# Patient Record
Sex: Male | Born: 1980 | Race: Black or African American | Hispanic: No | Marital: Single | Smoking: Never smoker
Health system: Southern US, Community
[De-identification: ages and names within clinical notes are randomized; demographics above are authoritative.]

## PROBLEM LIST (undated history)

## (undated) DIAGNOSIS — E119 Type 2 diabetes mellitus without complications: Secondary | ICD-10-CM

## (undated) HISTORY — PX: CORONARY ARTERY BYPASS GRAFT: SHX141

## (undated) HISTORY — PX: CARDIAC SURGERY: SHX584

---

## 2014-08-05 ENCOUNTER — Encounter (HOSPITAL_BASED_OUTPATIENT_CLINIC_OR_DEPARTMENT_OTHER): Payer: Self-pay

## 2014-08-05 ENCOUNTER — Emergency Department (HOSPITAL_BASED_OUTPATIENT_CLINIC_OR_DEPARTMENT_OTHER): Payer: Self-pay

## 2014-08-05 ENCOUNTER — Emergency Department (HOSPITAL_BASED_OUTPATIENT_CLINIC_OR_DEPARTMENT_OTHER)
Admission: EM | Admit: 2014-08-05 | Discharge: 2014-08-05 | Disposition: A | Discharge status: Home / Self Care | Payer: Self-pay | Attending: Emergency Medicine | Admitting: Emergency Medicine

## 2014-08-05 DIAGNOSIS — R221 Localized swelling, mass and lump, neck: Secondary | ICD-10-CM

## 2014-08-05 DIAGNOSIS — L0211 Cutaneous abscess of neck: Secondary | ICD-10-CM | POA: Insufficient documentation

## 2014-08-05 DIAGNOSIS — R739 Hyperglycemia, unspecified: Secondary | ICD-10-CM | POA: Insufficient documentation

## 2014-08-05 LAB — CBC WITH DIFFERENTIAL/PLATELET
Basophils Absolute: 0.1 10*3/uL (ref 0.0–0.1)
Basophils Relative: 1 % (ref 0–1)
EOS PCT: 1 % (ref 0–5)
Eosinophils Absolute: 0.1 10*3/uL (ref 0.0–0.7)
HCT: 44.7 % (ref 39.0–52.0)
Hemoglobin: 15.1 g/dL (ref 13.0–17.0)
LYMPHS ABS: 1.7 10*3/uL (ref 0.7–4.0)
LYMPHS PCT: 21 % (ref 12–46)
MCH: 26.8 pg (ref 26.0–34.0)
MCHC: 33.8 g/dL (ref 30.0–36.0)
MCV: 79.4 fL (ref 78.0–100.0)
MONO ABS: 0.7 10*3/uL (ref 0.1–1.0)
MONOS PCT: 8 % (ref 3–12)
Neutro Abs: 5.8 10*3/uL (ref 1.7–7.7)
Neutrophils Relative %: 69 % (ref 43–77)
Platelets: 336 10*3/uL (ref 150–400)
RBC: 5.63 MIL/uL (ref 4.22–5.81)
RDW: 11.8 % (ref 11.5–15.5)
WBC: 8.3 10*3/uL (ref 4.0–10.5)

## 2014-08-05 LAB — BASIC METABOLIC PANEL
Anion gap: 12 (ref 5–15)
BUN: 11 mg/dL (ref 6–23)
CO2: 27 mmol/L (ref 19–32)
Calcium: 9.9 mg/dL (ref 8.4–10.5)
Chloride: 93 mEq/L — ABNORMAL LOW (ref 96–112)
Creatinine, Ser: 1.24 mg/dL (ref 0.50–1.35)
GFR calc Af Amer: 88 mL/min — ABNORMAL LOW (ref >90)
GFR, EST NON AFRICAN AMERICAN: 76 mL/min — AB (ref >90)
GLUCOSE: 400 mg/dL — AB (ref 70–99)
Potassium: 4 mmol/L (ref 3.5–5.1)
Sodium: 132 mmol/L — ABNORMAL LOW (ref 135–145)

## 2014-08-05 LAB — I-STAT CG4 LACTIC ACID, ED: Lactic Acid, Venous: 1.54 mmol/L (ref 0.5–2.2)

## 2014-08-05 LAB — CBG MONITORING, ED
Glucose-Capillary: 348 mg/dL — ABNORMAL HIGH (ref 70–99)
Glucose-Capillary: 287 mg/dL — ABNORMAL HIGH (ref 70–99)

## 2014-08-05 MED ORDER — IOHEXOL 300 MG/ML  SOLN
75.0000 mL | Freq: Once | INTRAMUSCULAR | Status: AC | PRN
  Administered 2014-08-05: 75 mL via INTRAVENOUS

## 2014-08-05 MED ORDER — CLINDAMYCIN PHOSPHATE 600 MG/50ML IV SOLN
600.0000 mg | Freq: Once | INTRAVENOUS | Status: AC
  Administered 2014-08-05: 600 mg via INTRAVENOUS
  Filled 2014-08-05: qty 50

## 2014-08-05 MED ORDER — METFORMIN HCL 500 MG PO TABS: 500.0000 mg | ORAL_TABLET | Freq: Two times a day (BID) | ORAL | Status: DC

## 2014-08-05 MED ORDER — TRAMADOL HCL 50 MG PO TABS: 50.0000 mg | ORAL_TABLET | Freq: Four times a day (QID) | ORAL | Status: AC | PRN

## 2014-08-05 MED ORDER — SODIUM CHLORIDE 0.9 % IV BOLUS (SEPSIS)
1000.0000 mL | Freq: Once | INTRAVENOUS | Status: AC
  Administered 2014-08-05: 1000 mL via INTRAVENOUS

## 2014-08-05 MED ORDER — CLINDAMYCIN HCL 150 MG PO CAPS: 450.0000 mg | ORAL_CAPSULE | Freq: Three times a day (TID) | ORAL | Status: AC

## 2014-08-05 NOTE — ED Notes (Addendum)
Pt reports 4-5 days of swelling on L side of neck, no dental pain.  Denies swallowing or airway difficulty.  Reports hx of abscesses on back of neck.  Denies fever.  Reports took benadryl and swelling decreased and is now back.

## 2014-08-05 NOTE — Discharge Instructions (Signed)
Return to the emergency Department immediately if you develop difficulty swallowing or difficulty breathing.  Follow up with Dr. Pollyann Kennedy with ENT in the next 1-2 days.

## 2014-08-05 NOTE — ED Provider Notes (Signed)
Patient signed out to me by Santiago Glad, PA-C with plan to follow-up on patient's condition after fluid boluses.  6:00 PM: Fluid bolus given, patient CBG down to 287. Patient discharged with previous instructions to follow-up with Dr. Pollyann Kennedy with ENT, antibiotics, and encouraged to follow-up with wellness clinic for hyperglycemia. Return precautions discussed with patient, and patient encouraged to call or return to ER should he have any questions or concerns.  BP 130/91 mmHg  Pulse 82  Temp(Src) 98.7 F (37.1 C) (Tympanic)  Resp 18  Ht  (1.854 m)  Wt 240 lb (108.863 kg)  BMI 31.67 kg/m2  SpO2 100%  Signed,  Ladona Mow, PA-C 6:17 PM   Monte Fantasia, PA-C 08/05/14 [phone]

## 2014-08-05 NOTE — ED Provider Notes (Signed)
CSN: [id number]     Arrival date & time 08/05/14  1346 History   First MD Initiated Contact with Patient 08/05/14 1417     Chief Complaint  Patient presents with  . Lymphadenopathy     (Consider location/radiation/quality/duration/timing/severity/associated sxs/prior Treatment) HPI Comments: Patient presents with a chief complaint of swelling of the left lateral neck.  Swelling has been present for the past 3-4 days and is gradually worsening.  He reports that the area is painful with movement, but pain is minimal when not moving.  Area is also tender to palpation.  No acute injury or trauma.  No treatment prior to arrival.  He reports that he has a MRSA infection of his posterior neck in September 2015.  He denies fever, chills, sore throat, sinus pain, congestion, difficulty swallowing, nausea, vomiting, or difficulty breathing.  No history of DM or HIV.    The history is provided by the patient.    History reviewed. No pertinent past medical history. Past Surgical History  Procedure Laterality Date  . Cardiac surgery     No family history on file. History  Substance Use Topics  . Smoking status: Never Smoker   . Smokeless tobacco: Not on file  . Alcohol Use: Yes     Comment: occ    Review of Systems  All other systems reviewed and are negative.     Allergies  Review of patient's allergies indicates no known allergies.  Home Medications   Prior to Admission medications   Not on File   BP 125/86 mmHg  Pulse 100  Temp(Src) 98.7 F (37.1 C) (Tympanic)  Resp 18  Ht  (1.854 m)  Wt 240 lb (108.863 kg)  BMI 31.67 kg/m2  SpO2 99% Physical Exam  Constitutional: He appears well-developed and well-nourished.  HENT:  Head: Normocephalic and atraumatic.  Mouth/Throat: Uvula is midline, oropharynx is clear and moist and mucous membranes are normal. No oral lesions. No trismus in the jaw. No dental abscesses or uvula swelling. No oropharyngeal exudate, posterior  oropharyngeal edema, posterior oropharyngeal erythema or tonsillar abscesses.  Airway patent  Eyes: EOM are normal.  Neck: Normal range of motion. Neck supple. Normal carotid pulses present. No spinous process tenderness present. No rigidity. No erythema and normal range of motion present.    Cardiovascular: Normal rate, regular rhythm and normal heart sounds.   Pulmonary/Chest: Effort normal and breath sounds normal. No stridor.  Musculoskeletal: Normal range of motion.  Neurological: He is alert.  Skin: Skin is warm and dry.  Psychiatric: He has a normal mood and affect.  Nursing note and vitals reviewed.   ED Course  Procedures (including critical care time) Labs Review Labs Reviewed  CBC WITH DIFFERENTIAL  BASIC METABOLIC PANEL  LACTIC ACID, PLASMA  I-STAT CG4 LACTIC ACID, ED    Imaging Review Ct Soft Tissue Neck W Contrast  08/05/2014   CLINICAL DATA:  Left neck mass with associated pain for the past 3 days.  EXAM: CT NECK WITH CONTRAST  TECHNIQUE: Multidetector CT imaging of the neck was performed using the standard protocol following the bolus administration of intravenous contrast.  CONTRAST:  75mL OMNIPAQUE IOHEXOL 300 MG/ML  SOLN  COMPARISON:  None.  FINDINGS: Pharynx and larynx: Unremarkable.  Salivary glands: Unremarkable.  Thyroid: Unremarkable.  Lymph nodes: Multiple enlarged lymph nodes throughout the left neck. There is associated soft tissue stranding in the fat at the posterior triangle and in the posterior subcutaneous fat. There is a small fluid collection with  associated rim enhancement in the adjacent musculature at the posterior aspect of the posterior triangle on the left. This measures 1.2 x 0.8 cm on image number 51. This measures 1.1 cm in length on sagittal image number 91. There is also a posterior cervical node with some central low density on image number 39, measuring 1.3 x 1.0 cm in maximum dimensions and ill-defined margins.  Vascular: Unremarkable.   Limited intracranial: Unremarkable.  Mastoids and visualized paranasal sinuses: Small left maxillary sinus retention cysts. Normally pneumatized mastoids.  Skeleton: Unremarkable.  Upper chest: Unremarkable.  IMPRESSION: Left neck suppurative adenitis with a 1.2 x 1.1 x 0.8 cm left posterior intramuscular abscess.   Electronically Signed   By: Gordan Payment M.D.   On: 08/05/2014 15:38     EKG Interpretation None     4:00 PM Patient discussed with Dr. Pollyann Kennedy with ENT.  He recommends starting the patient on Clindamycin and follow up in the office in the next 1-2 days. MDM   Final diagnoses:  Mass of left side of neck   Patient presents today with swelling of the left posterior lateral neck that has been present for the past 3-4 days.  CT scan today showing left posterior intramuscular abscess and suppurative adenitis.  No erythema or warmth of the area.  No difficulty breathing or swallowing. He is afebrile.  No leukocytosis.  Lactate WNL.  VSS.  Non toxic appearing.  Discussed CT results with Dr. Pollyann Kennedy with ENT who recommended starting the patient on Clindamycin and follow up in the office in the next 1-2 days.  Patient also found to have an elevated blood glucose of 400.  No known history of DM.  No recent steroid use.  Anion gap is 12.  Bicarb is 27.  Therefore, do not feel that the patient is in DKA.  Patient given 2 L NS IVF.  Plan is to recheck blood sugar after IVF is completed.  Patient signed out to Ladona Mow, PA-C at shift change who will follow up on the blood sugar results.  Patient is to be discharged home on Clindamycin and Metformin.  Patient instructed to follow up with Dr. Pollyann Kennedy with ENT for the abscess and PCP for new onset DM.  Patient verbalizes understanding.  Return precautions given.      Santiago Glad, PA-C 08/07/14 1142  Glynn Octave, MD 08/07/14 [phone]

## 2014-10-14 ENCOUNTER — Encounter (HOSPITAL_COMMUNITY): Payer: Self-pay | Admitting: Emergency Medicine

## 2014-10-14 ENCOUNTER — Emergency Department (HOSPITAL_COMMUNITY): Payer: BLUE CROSS/BLUE SHIELD

## 2014-10-14 ENCOUNTER — Emergency Department (HOSPITAL_COMMUNITY)
Admission: EM | Admit: 2014-10-14 | Discharge: 2014-10-14 | Disposition: A | Discharge status: Home / Self Care | Payer: BLUE CROSS/BLUE SHIELD | Attending: Emergency Medicine | Admitting: Emergency Medicine

## 2014-10-14 DIAGNOSIS — S61411A Laceration without foreign body of right hand, initial encounter: Secondary | ICD-10-CM | POA: Diagnosis not present

## 2014-10-14 DIAGNOSIS — S61412A Laceration without foreign body of left hand, initial encounter: Secondary | ICD-10-CM | POA: Insufficient documentation

## 2014-10-14 DIAGNOSIS — Y92410 Unspecified street and highway as the place of occurrence of the external cause: Secondary | ICD-10-CM | POA: Insufficient documentation

## 2014-10-14 DIAGNOSIS — S81812A Laceration without foreign body, left lower leg, initial encounter: Secondary | ICD-10-CM | POA: Insufficient documentation

## 2014-10-14 DIAGNOSIS — Y998 Other external cause status: Secondary | ICD-10-CM | POA: Diagnosis not present

## 2014-10-14 DIAGNOSIS — Z951 Presence of aortocoronary bypass graft: Secondary | ICD-10-CM | POA: Diagnosis not present

## 2014-10-14 DIAGNOSIS — S59912A Unspecified injury of left forearm, initial encounter: Secondary | ICD-10-CM | POA: Diagnosis present

## 2014-10-14 DIAGNOSIS — Y9389 Activity, other specified: Secondary | ICD-10-CM | POA: Insufficient documentation

## 2014-10-14 DIAGNOSIS — S80212A Abrasion, left knee, initial encounter: Secondary | ICD-10-CM | POA: Insufficient documentation

## 2014-10-14 DIAGNOSIS — S51812A Laceration without foreign body of left forearm, initial encounter: Secondary | ICD-10-CM | POA: Insufficient documentation

## 2014-10-14 DIAGNOSIS — Z9889 Other specified postprocedural states: Secondary | ICD-10-CM | POA: Diagnosis not present

## 2014-10-14 DIAGNOSIS — IMO0002 Reserved for concepts with insufficient information to code with codable children: Secondary | ICD-10-CM

## 2014-10-14 MED ORDER — OXYCODONE-ACETAMINOPHEN 5-325 MG PO TABS
2.0000 | ORAL_TABLET | Freq: Once | ORAL | Status: AC
  Administered 2014-10-14: 2 via ORAL
  Filled 2014-10-14: qty 2

## 2014-10-14 MED ORDER — LIDOCAINE-EPINEPHRINE (PF) 2 %-1:200000 IJ SOLN
10.0000 mL | Freq: Once | INTRAMUSCULAR | Status: AC
  Administered 2014-10-14: 10 mL
  Filled 2014-10-14: qty 10

## 2014-10-14 MED ORDER — CEPHALEXIN 500 MG PO CAPS: 500.0000 mg | ORAL_CAPSULE | Freq: Four times a day (QID) | ORAL | Status: AC
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
## 2014-10-14 NOTE — ED Notes (Signed)
Wound care completed to wounds.  Deeper laceration noted to left hand.  Pt has abrasions and pain to bil hands, left knee, left forearm, and left leg.

## 2014-10-14 NOTE — Discharge Instructions (Signed)
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
## 2021-04-27 ENCOUNTER — Other Ambulatory Visit: Payer: Self-pay

## 2021-04-27 ENCOUNTER — Emergency Department (HOSPITAL_COMMUNITY)
Admission: EM | Admit: 2021-04-27 | Discharge: 2021-04-28 | Discharge status: Against Medical Advice | Payer: BLUE CROSS/BLUE SHIELD | Attending: Emergency Medicine | Admitting: Emergency Medicine

## 2021-04-27 DIAGNOSIS — R739 Hyperglycemia, unspecified: Secondary | ICD-10-CM | POA: Insufficient documentation

## 2021-04-27 DIAGNOSIS — Z5321 Procedure and treatment not carried out due to patient leaving prior to being seen by health care provider: Secondary | ICD-10-CM | POA: Insufficient documentation

## 2021-04-27 LAB — CBC WITH DIFFERENTIAL/PLATELET
Abs Immature Granulocytes: 0.01 10*3/uL (ref 0.00–0.07)
Basophils Absolute: 0.1 10*3/uL (ref 0.0–0.1)
Basophils Relative: 1 %
Eosinophils Absolute: 0.5 10*3/uL (ref 0.0–0.5)
Eosinophils Relative: 10 %
HCT: 49.8 % (ref 39.0–52.0)
Hemoglobin: 16.5 g/dL (ref 13.0–17.0)
Immature Granulocytes: 0 %
Lymphocytes Relative: 54 %
Lymphs Abs: 2.9 10*3/uL (ref 0.7–4.0)
MCH: 27.9 pg (ref 26.0–34.0)
MCHC: 33.1 g/dL (ref 30.0–36.0)
MCV: 84.3 fL (ref 80.0–100.0)
Monocytes Absolute: 0.3 10*3/uL (ref 0.1–1.0)
Monocytes Relative: 6 %
Neutro Abs: 1.6 10*3/uL — ABNORMAL LOW (ref 1.7–7.7)
Neutrophils Relative %: 29 %
Platelets: 297 10*3/uL (ref 150–400)
RBC: 5.91 MIL/uL — ABNORMAL HIGH (ref 4.22–5.81)
RDW: 11.7 % (ref 11.5–15.5)
WBC: 5.4 10*3/uL (ref 4.0–10.5)
nRBC: 0 % (ref 0.0–0.2)

## 2021-04-27 LAB — I-STAT VENOUS BLOOD GAS, ED
Acid-Base Excess: 2 mmol/L (ref 0.0–2.0)
Bicarbonate: 26 mmol/L (ref 20.0–28.0)
Calcium, Ion: 1.12 mmol/L — ABNORMAL LOW (ref 1.15–1.40)
HCT: 52 % (ref 39.0–52.0)
Hemoglobin: 17.7 g/dL — ABNORMAL HIGH (ref 13.0–17.0)
O2 Saturation: 97 %
Potassium: 4.1 mmol/L (ref 3.5–5.1)
Sodium: 137 mmol/L (ref 135–145)
TCO2: 27 mmol/L (ref 22–32)
pCO2, Ven: 38.6 mmHg — ABNORMAL LOW (ref 44.0–60.0)
pH, Ven: 7.437 — ABNORMAL HIGH (ref 7.250–7.430)
pO2, Ven: 88 mmHg — ABNORMAL HIGH (ref 32.0–45.0)

## 2021-04-27 LAB — BASIC METABOLIC PANEL
Anion gap: 9 (ref 5–15)
BUN: 12 mg/dL (ref 6–20)
CO2: 23 mmol/L (ref 22–32)
Calcium: 9.5 mg/dL (ref 8.9–10.3)
Chloride: 101 mmol/L (ref 98–111)
Creatinine, Ser: 0.97 mg/dL (ref 0.61–1.24)
GFR, Estimated: >60 mL/min (ref >60)
Glucose, Bld: 306 mg/dL — ABNORMAL HIGH (ref 70–99)
Potassium: 4 mmol/L (ref 3.5–5.1)
Sodium: 133 mmol/L — ABNORMAL LOW (ref 135–145)

## 2021-04-27 LAB — URINALYSIS, ROUTINE W REFLEX MICROSCOPIC
Bacteria, UA: NONE SEEN
Bilirubin Urine: NEGATIVE
Glucose, UA: >=500 mg/dL — AB
Hgb urine dipstick: NEGATIVE
Ketones, ur: 5 mg/dL — AB
Leukocytes,Ua: NEGATIVE
Nitrite: NEGATIVE
Protein, ur: NEGATIVE mg/dL
Specific Gravity, Urine: 1.029 (ref 1.005–1.030)
pH: 5 (ref 5.0–8.0)

## 2021-04-27 LAB — CBG MONITORING, ED: Glucose-Capillary: 308 mg/dL — ABNORMAL HIGH (ref 70–99)

## 2021-04-27 NOTE — ED Provider Notes (Signed)
Emergency Medicine Provider Triage Evaluation Note  Elih Mooney , a 40 y.o. male  was evaluated in triage.  Pt complains of nausea, fatigue and hyperglycemia. Ran out of meds 2 weeks ago because he got a new job.  Review of Systems  Positive: Hyperglycemia, nausea, fatigue Negative: vomiting  Physical Exam  BP (!) 140/97 (BP Location: Left Arm)   Pulse 94   Temp 98.6 F (37 C) (Oral)   Resp 16   SpO2 98%  Gen:   Awake, no distress   Resp:  Normal effort  MSK:   Moves extremities without difficulty   Medical Decision Making  Medically screening exam initiated at 5:30 PM.  Appropriate orders placed.  Ezequiel Zobrist was informed that the remainder of the evaluation will be completed by another provider, this initial triage assessment does not replace that evaluation, and the importance of remaining in the ED until their evaluation is complete.     Rayne Du 04/27/21 1731    Linwood Dibbles, MD 04/27/21 2148

## 2021-04-27 NOTE — ED Notes (Signed)
Pt left AMA °

## 2021-04-27 NOTE — ED Triage Notes (Signed)
Pt here for eval of nausea and dizziness secondary to hyperglycemia. Has been out of his diabetes medication x 2 weeks d/t insurance problems.

## 2021-05-24 ENCOUNTER — Encounter (HOSPITAL_BASED_OUTPATIENT_CLINIC_OR_DEPARTMENT_OTHER): Payer: Self-pay | Admitting: *Deleted

## 2021-05-24 ENCOUNTER — Emergency Department (HOSPITAL_BASED_OUTPATIENT_CLINIC_OR_DEPARTMENT_OTHER)
Admission: EM | Admit: 2021-05-24 | Discharge: 2021-05-24 | Disposition: A | Discharge status: Home / Self Care | Payer: Self-pay | Attending: Emergency Medicine | Admitting: Emergency Medicine

## 2021-05-24 ENCOUNTER — Other Ambulatory Visit: Payer: Self-pay

## 2021-05-24 DIAGNOSIS — M791 Myalgia, unspecified site: Secondary | ICD-10-CM | POA: Insufficient documentation

## 2021-05-24 DIAGNOSIS — Z7984 Long term (current) use of oral hypoglycemic drugs: Secondary | ICD-10-CM | POA: Insufficient documentation

## 2021-05-24 DIAGNOSIS — E1165 Type 2 diabetes mellitus with hyperglycemia: Secondary | ICD-10-CM | POA: Insufficient documentation

## 2021-05-24 DIAGNOSIS — J101 Influenza due to other identified influenza virus with other respiratory manifestations: Secondary | ICD-10-CM | POA: Insufficient documentation

## 2021-05-24 DIAGNOSIS — Z951 Presence of aortocoronary bypass graft: Secondary | ICD-10-CM | POA: Insufficient documentation

## 2021-05-24 DIAGNOSIS — R739 Hyperglycemia, unspecified: Secondary | ICD-10-CM

## 2021-05-24 DIAGNOSIS — Z20822 Contact with and (suspected) exposure to covid-19: Secondary | ICD-10-CM | POA: Insufficient documentation

## 2021-05-24 HISTORY — DX: Type 2 diabetes mellitus without complications: E11.9

## 2021-05-24 LAB — CBG MONITORING, ED
Glucose-Capillary: 274 mg/dL — ABNORMAL HIGH (ref 70–99)
Glucose-Capillary: 226 mg/dL — ABNORMAL HIGH (ref 70–99)

## 2021-05-24 LAB — RESP PANEL BY RT-PCR (FLU A&B, COVID) ARPGX2
Influenza A by PCR: POSITIVE — AB
Influenza B by PCR: NEGATIVE
SARS Coronavirus 2 by RT PCR: NEGATIVE

## 2021-05-24 MED ORDER — METFORMIN HCL 500 MG PO TABS: 500.0000 mg | ORAL_TABLET | Freq: Two times a day (BID) | ORAL | 0 refills | Status: AC

## 2021-05-24 MED ORDER — PROMETHAZINE-DM 6.25-15 MG/5ML PO SYRP: 5.0000 mL | ORAL_SOLUTION | Freq: Four times a day (QID) | ORAL | 0 refills | Status: AC | PRN

## 2021-05-24 MED ORDER — ACETAMINOPHEN 500 MG PO TABS
1000.0000 mg | ORAL_TABLET | Freq: Once | ORAL | Status: AC
  Administered 2021-05-24: 1000 mg via ORAL
  Filled 2021-05-24: qty 2

## 2021-05-24 MED ORDER — BENZONATATE 100 MG PO CAPS: 100.0000 mg | ORAL_CAPSULE | Freq: Three times a day (TID) | ORAL | 0 refills | Status: AC

## 2021-05-24 MED ORDER — ONDANSETRON 4 MG PO TBDP
4.0000 mg | ORAL_TABLET | Freq: Once | ORAL | Status: AC
  Administered 2021-05-24: 4 mg via ORAL
  Filled 2021-05-24: qty 1

## 2021-05-24 NOTE — ED Provider Notes (Signed)
[facility] EMERGENCY DEPARTMENT Provider Note   CSN: [id number] Arrival date & time: 05/24/21  1809     History Chief Complaint  Patient presents with   Fever    Jaime Pruitt is a 40 y.o. male.  HPI Patient is a 40 year old male with past medical history significant for DM2 states that he ran out of his metformin prescription 2 weeks ago and has not taken any since.  States that he has had cough fevers body aches and chills and fatigue for the past 2 days.  States he has been feeling somewhat more than usual but not significantly so.  States he has been eating and drinking fine.  Denies any nausea or vomiting no chest pain or shortness of breath no lightheadedness or dizziness.  Came to the ER today for the symptoms.  States that he has not taken his temperature at home but felt subjectively that he had a fever was found to have a temperature of 100.3 in triage.  No hemoptysis or chest pain.  No other associate symptoms.  No aggravating mitigating factors.  Apart from somewhat better with antipyretics although he has taken none today.     Past Medical History:  Diagnosis Date   Diabetes mellitus without complication (HCC)     There are no problems to display for this patient.   Past Surgical History:  Procedure Laterality Date   CARDIAC SURGERY     CORONARY ARTERY BYPASS GRAFT         No family history on file.  Social History   Tobacco Use   Smoking status: Never   Smokeless tobacco: Never  Vaping Use   Vaping Use: Never used  Substance Use Topics   Alcohol use: Yes    Comment: occ   Drug use: No    Home Medications Prior to Admission medications   Medication Sig Start Date End Date Taking? Authorizing Provider  benzonatate (TESSALON) 100 MG capsule Take 1 capsule (100 mg total) by mouth every 8 (eight) hours. 05/24/21  Yes Ahmod Gillespie S, PA  promethazine-dextromethorphan (PROMETHAZINE-DM) 6.25-15 MG/5ML syrup Take 5 mLs by mouth 4 (four)  times daily as needed for cough. 05/24/21  Yes Cierrah Dace S, PA  cephALEXin (KEFLEX) 500 MG capsule Take 1 capsule (500 mg total) by mouth 4 (four) times daily. 10/14/14   Cartner, Sharlet Salina, PA-C  clindamycin (CLEOCIN) 150 MG capsule Take 3 capsules (450 mg total) by mouth 3 (three) times daily. Patient not taking: Reported on 10/14/2014 08/05/14   Santiago Glad, PA-C  ibuprofen (ADVIL,MOTRIN) 200 MG tablet Take 800 mg by mouth every 6 (six) hours as needed for moderate pain.    [provider]  metFORMIN (GLUCOPHAGE) 500 MG tablet Take 1 tablet (500 mg total) by mouth 2 (two) times daily with a meal. 05/24/21   Hyder Deman, Rodrigo Ran, PA  traMADol (ULTRAM) 50 MG tablet Take 1 tablet (50 mg total) by mouth every 6 (six) hours as needed. Patient not taking: Reported on 10/14/2014 08/05/14   Santiago Glad, PA-C    Allergies    Patient has no known allergies.  Review of Systems   Review of Systems  Constitutional:  Positive for chills, fatigue and fever.  HENT:  Positive for congestion.   Eyes:  Negative for pain.  Respiratory:  Positive for cough. Negative for shortness of breath.   Cardiovascular:  Negative for chest pain and leg swelling.  Gastrointestinal:  Negative for abdominal pain and vomiting.  Genitourinary:  Negative for  dysuria.  Musculoskeletal:  Positive for myalgias.  Skin:  Negative for rash.  Neurological:  Negative for dizziness and headaches.   Physical Exam Updated Vital Signs BP (!) 129/91   Pulse 89   Temp 98.2 F (36.8 C) (Oral)   Resp 20   Ht 6\' 1"  (1.854 m)   Wt 100.2 kg   SpO2 95%   BMI 29.16 kg/m   Physical Exam Vitals and nursing note reviewed.  Constitutional:      General: He is not in acute distress.    Comments: Pleasant well-appearing 40 year old.  In no acute distress.  Sitting comfortably in bed.  Able answer questions appropriately follow commands. No increased work of breathing. Speaking in full sentences.   HENT:     Head:  Normocephalic and atraumatic.     Nose: Nose normal.  Eyes:     General: No scleral icterus. Cardiovascular:     Rate and Rhythm: Normal rate and regular rhythm.     Pulses: Normal pulses.     Heart sounds: Normal heart sounds.  Pulmonary:     Effort: Pulmonary effort is normal. No respiratory distress.     Breath sounds: Normal breath sounds. No wheezing.  Abdominal:     Palpations: Abdomen is soft.     Tenderness: There is no abdominal tenderness.  Musculoskeletal:     Cervical back: Normal range of motion.     Right lower leg: No edema.     Left lower leg: No edema.  Skin:    General: Skin is warm and dry.     Capillary Refill: Capillary refill takes less than 2 seconds.  Neurological:     Mental Status: He is alert. Mental status is at baseline.  Psychiatric:        Mood and Affect: Mood normal.        Behavior: Behavior normal.    ED Results / Procedures / Treatments   Labs (all labs ordered are listed, but only abnormal results are displayed) Labs Reviewed  RESP PANEL BY RT-PCR (FLU A&B, COVID) ARPGX2 - Abnormal; Notable for the following components:      Result Value   Influenza A by PCR POSITIVE (*)    All other components within normal limits  CBG MONITORING, ED - Abnormal; Notable for the following components:   Glucose-Capillary 274 (*)    All other components within normal limits  CBG MONITORING, ED    EKG None  Radiology No results found.  Procedures Procedures   Medications Ordered in ED Medications  acetaminophen (TYLENOL) tablet 1,000 mg (1,000 mg Oral Given 05/24/21 2139)  ondansetron (ZOFRAN-ODT) disintegrating tablet 4 mg (4 mg Oral Given 05/24/21 2140)    ED Course  I have reviewed the triage vital signs and the nursing notes.  Pertinent labs & imaging results that were available during my care of the patient were reviewed by me and considered in my medical decision making (see chart for details).    MDM Rules/Calculators/A&P                           Patient is a 40 year old male history of diabetes off his metformin for 2 weeks now because he has not had it refilled.  Presented today with aches cough congestion fevers chills.  Found to be hyperglycemic with blood sugar of 274 this is likely multifactorial 1 because of viral illness and to because of noncompliance of medications  Offered to obtain labs  to ensure the patient is not in DKA.  He states that he feels pretty well after he was giving Tylenol.  His tachycardia resolved and he has no nausea vomiting abdominal pain is breathing normally and states that he would prefer to hold off on extensive work-up states that he would like to try hydrating p.o.  Positive for influenza A.  Negative for COVID and flu B.   Blood sugar improved to 226.  Patient tolerated p.o. fluids without difficulty.  Feels well.  Return precautions are given and I represcribed his metformin along with benzonatate and Promethazine DM.  Mussa Justiniano was evaluated in Emergency Department on 05/24/2021 for the symptoms described in the history of present illness. He was evaluated in the context of the global COVID-19 pandemic, which necessitated consideration that the patient might be at risk for infection with the SARS-CoV-2 virus that causes COVID-19. Institutional protocols and algorithms that pertain to the evaluation of patients at risk for COVID-19 are in a state of rapid change based on information released by regulatory bodies including the CDC and federal and state organizations. These policies and algorithms were followed during the patient's care in the ED.   Final Clinical Impression(s) / ED Diagnoses Final diagnoses:  Influenza A  Hyperglycemia    Rx / DC Orders ED Discharge Orders          Ordered    metFORMIN (GLUCOPHAGE) 500 MG tablet  2 times daily with meals        05/24/21 2129    benzonatate (TESSALON) 100 MG capsule  Every 8 hours        05/24/21 2129     promethazine-dextromethorphan (PROMETHAZINE-DM) 6.25-15 MG/5ML syrup  4 times daily PRN        05/24/21 2129             Gailen Shelter, [place] 05/25/21 Virginia Rochester, MD 05/27/21 [phone]

## 2021-05-24 NOTE — ED Triage Notes (Signed)
Cough, fever, body aches x2 days

## 2021-05-24 NOTE — Discharge Instructions (Signed)
Please drink plenty of water.  Please take the cough medications I prescribed you an alternate between Tylenol and ibuprofen.  It is imperative that you drink lots of water.  Please follow-up with your primary care provider to recheck your blood sugars and check your general labs within the next few days.  Begin taking the metformin that I prescribed you

## 2022-03-01 NOTE — Progress Notes (Signed)
Erroneous encounter-disregard

## 2022-03-10 ENCOUNTER — Encounter: Payer: Self-pay | Admitting: Family

## 2022-03-10 DIAGNOSIS — Z7689 Persons encountering health services in other specified circumstances: Secondary | ICD-10-CM

## 2023-01-24 ENCOUNTER — Emergency Department (HOSPITAL_BASED_OUTPATIENT_CLINIC_OR_DEPARTMENT_OTHER): Payer: BC Managed Care – PPO

## 2023-01-24 ENCOUNTER — Emergency Department (HOSPITAL_BASED_OUTPATIENT_CLINIC_OR_DEPARTMENT_OTHER)
Admission: EM | Admit: 2023-01-24 | Discharge: 2023-01-24 | Disposition: A | Discharge status: Home / Self Care | Payer: BC Managed Care – PPO | Attending: Emergency Medicine | Admitting: Emergency Medicine

## 2023-01-24 ENCOUNTER — Encounter (HOSPITAL_BASED_OUTPATIENT_CLINIC_OR_DEPARTMENT_OTHER): Payer: Self-pay | Admitting: Emergency Medicine

## 2023-01-24 DIAGNOSIS — I10 Essential (primary) hypertension: Secondary | ICD-10-CM

## 2023-01-24 DIAGNOSIS — R739 Hyperglycemia, unspecified: Secondary | ICD-10-CM

## 2023-01-24 DIAGNOSIS — Z79899 Other long term (current) drug therapy: Secondary | ICD-10-CM | POA: Diagnosis not present

## 2023-01-24 DIAGNOSIS — Y92410 Unspecified street and highway as the place of occurrence of the external cause: Secondary | ICD-10-CM | POA: Insufficient documentation

## 2023-01-24 DIAGNOSIS — E1165 Type 2 diabetes mellitus with hyperglycemia: Secondary | ICD-10-CM | POA: Insufficient documentation

## 2023-01-24 DIAGNOSIS — R55 Syncope and collapse: Secondary | ICD-10-CM | POA: Diagnosis not present

## 2023-01-24 DIAGNOSIS — Z7984 Long term (current) use of oral hypoglycemic drugs: Secondary | ICD-10-CM | POA: Diagnosis not present

## 2023-01-24 DIAGNOSIS — S46912A Strain of unspecified muscle, fascia and tendon at shoulder and upper arm level, left arm, initial encounter: Secondary | ICD-10-CM | POA: Diagnosis not present

## 2023-01-24 DIAGNOSIS — S40912A Unspecified superficial injury of left shoulder, initial encounter: Secondary | ICD-10-CM | POA: Diagnosis present

## 2023-01-24 HISTORY — DX: Type 2 diabetes mellitus without complications: E11.9

## 2023-01-24 LAB — BASIC METABOLIC PANEL
Anion gap: 11 (ref 5–15)
BUN: 15 mg/dL (ref 6–20)
CO2: 29 mmol/L (ref 22–32)
Calcium: 9.4 mg/dL (ref 8.9–10.3)
Chloride: 97 mmol/L — ABNORMAL LOW (ref 98–111)
Creatinine, Ser: 1.17 mg/dL (ref 0.61–1.24)
GFR, Estimated: >60 mL/min (ref >60)
Glucose, Bld: 321 mg/dL — ABNORMAL HIGH (ref 70–99)
Potassium: 3.8 mmol/L (ref 3.5–5.1)
Sodium: 137 mmol/L (ref 135–145)

## 2023-01-24 LAB — CBC WITH DIFFERENTIAL/PLATELET
Abs Immature Granulocytes: 0.01 10*3/uL (ref 0.00–0.07)
Basophils Absolute: 0.1 10*3/uL (ref 0.0–0.1)
Basophils Relative: 1 %
Eosinophils Absolute: 0.6 10*3/uL — ABNORMAL HIGH (ref 0.0–0.5)
Eosinophils Relative: 9 %
HCT: 42.5 % (ref 39.0–52.0)
Hemoglobin: 14 g/dL (ref 13.0–17.0)
Immature Granulocytes: 0 %
Lymphocytes Relative: 45 %
Lymphs Abs: 2.8 10*3/uL (ref 0.7–4.0)
MCH: 27.9 pg (ref 26.0–34.0)
MCHC: 32.9 g/dL (ref 30.0–36.0)
MCV: 84.8 fL (ref 80.0–100.0)
Monocytes Absolute: 0.5 10*3/uL (ref 0.1–1.0)
Monocytes Relative: 7 %
Neutro Abs: 2.4 10*3/uL (ref 1.7–7.7)
Neutrophils Relative %: 38 %
Platelets: 272 10*3/uL (ref 150–400)
RBC: 5.01 MIL/uL (ref 4.22–5.81)
RDW: 11.8 % (ref 11.5–15.5)
WBC: 6.3 10*3/uL (ref 4.0–10.5)
nRBC: 0 % (ref 0.0–0.2)

## 2023-01-24 LAB — URINALYSIS, ROUTINE W REFLEX MICROSCOPIC
Bilirubin Urine: NEGATIVE
Glucose, UA: >=500 mg/dL — AB
Hgb urine dipstick: NEGATIVE
Ketones, ur: NEGATIVE mg/dL
Leukocytes,Ua: NEGATIVE
Nitrite: NEGATIVE
Protein, ur: NEGATIVE mg/dL
Specific Gravity, Urine: 1.025 (ref 1.005–1.030)
pH: 5.5 (ref 5.0–8.0)

## 2023-01-24 LAB — URINALYSIS, MICROSCOPIC (REFLEX): Bacteria, UA: NONE SEEN

## 2023-01-24 LAB — CBG MONITORING, ED
Glucose-Capillary: 318 mg/dL — ABNORMAL HIGH (ref 70–99)
Glucose-Capillary: 181 mg/dL — ABNORMAL HIGH (ref 70–99)

## 2023-01-24 LAB — TROPONIN I (HIGH SENSITIVITY): Troponin I (High Sensitivity): 5 ng/L (ref <18)

## 2023-01-24 LAB — HEMOGLOBIN A1C
Hgb A1c MFr Bld: 10.1 % — ABNORMAL HIGH (ref 4.8–5.6)
Mean Plasma Glucose: 243.17 mg/dL

## 2023-01-24 MED ORDER — INSULIN ASPART 100 UNIT/ML IJ SOLN
8.0000 [IU] | Freq: Once | INTRAMUSCULAR | Status: AC
  Administered 2023-01-24: 8 [IU] via INTRAVENOUS

## 2023-01-24 MED ORDER — SODIUM CHLORIDE 0.9 % IV BOLUS
1000.0000 mL | Freq: Once | INTRAVENOUS | Status: AC
  Administered 2023-01-24: 1000 mL via INTRAVENOUS

## 2023-01-24 MED ORDER — INSULIN REGULAR HUMAN 100 UNIT/ML IJ SOLN: 8.0000 [IU] | Freq: Once | INTRAMUSCULAR | Status: DC

## 2023-01-24 MED ORDER — SODIUM CHLORIDE 0.9 % IV SOLN
INTRAVENOUS | Status: DC
Start: 2023-01-24 — End: 2023-01-24
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
## 2023-04-01 ENCOUNTER — Encounter: Payer: Self-pay | Admitting: Cardiology

## 2023-04-05 ENCOUNTER — Ambulatory Visit: Payer: BC Managed Care – PPO | Attending: Cardiology | Admitting: Cardiology

## 2024-05-06 ENCOUNTER — Other Ambulatory Visit: Payer: Self-pay

## 2024-05-06 ENCOUNTER — Encounter (HOSPITAL_COMMUNITY): Payer: Self-pay | Admitting: *Deleted

## 2024-05-06 ENCOUNTER — Emergency Department (HOSPITAL_COMMUNITY)
Admission: EM | Admit: 2024-05-06 | Discharge: 2024-05-07 | Disposition: A | Attending: Emergency Medicine | Admitting: Emergency Medicine

## 2024-05-06 DIAGNOSIS — R739 Hyperglycemia, unspecified: Secondary | ICD-10-CM | POA: Insufficient documentation

## 2024-05-06 LAB — COMPREHENSIVE METABOLIC PANEL WITH GFR
ALT: 30 U/L (ref 0–44)
AST: 24 U/L (ref 15–41)
Albumin: 3.7 g/dL (ref 3.5–5.0)
Alkaline Phosphatase: 58 U/L (ref 38–126)
Anion gap: 11 (ref 5–15)
BUN: 16 mg/dL (ref 6–20)
CO2: 22 mmol/L (ref 22–32)
Calcium: 9 mg/dL (ref 8.9–10.3)
Chloride: 100 mmol/L (ref 98–111)
Creatinine, Ser: 1.25 mg/dL — ABNORMAL HIGH (ref 0.61–1.24)
GFR, Estimated: >60 mL/min (ref >60)
Glucose, Bld: 367 mg/dL — ABNORMAL HIGH (ref 70–99)
Potassium: 4.1 mmol/L (ref 3.5–5.1)
Sodium: 133 mmol/L — ABNORMAL LOW (ref 135–145)
Total Bilirubin: 1.3 mg/dL — ABNORMAL HIGH (ref 0.0–1.2)
Total Protein: 7.1 g/dL (ref 6.5–8.1)

## 2024-05-06 LAB — CBC
HCT: 45.3 % (ref 39.0–52.0)
Hemoglobin: 14.7 g/dL (ref 13.0–17.0)
MCH: 27.5 pg (ref 26.0–34.0)
MCHC: 32.5 g/dL (ref 30.0–36.0)
MCV: 84.7 fL (ref 80.0–100.0)
Platelets: 292 K/uL (ref 150–400)
RBC: 5.35 MIL/uL (ref 4.22–5.81)
RDW: 11.9 % (ref 11.5–15.5)
WBC: 6.2 K/uL (ref 4.0–10.5)
nRBC: 0 % (ref 0.0–0.2)

## 2024-05-06 LAB — URINALYSIS, ROUTINE W REFLEX MICROSCOPIC
Bacteria, UA: NONE SEEN
Bilirubin Urine: NEGATIVE
Glucose, UA: >=500 mg/dL — AB
Hgb urine dipstick: NEGATIVE
Ketones, ur: NEGATIVE mg/dL
Leukocytes,Ua: NEGATIVE
Nitrite: NEGATIVE
Protein, ur: NEGATIVE mg/dL
Specific Gravity, Urine: 1.027 (ref 1.005–1.030)
pH: 5 (ref 5.0–8.0)

## 2024-05-06 LAB — CBG MONITORING, ED: Glucose-Capillary: 327 mg/dL — ABNORMAL HIGH (ref 70–99)

## 2024-05-06 LAB — LIPASE, BLOOD: Lipase: 25 U/L (ref 11–51)

## 2024-05-06 MED ORDER — INSULIN ASPART 100 UNIT/ML IJ SOLN
10.0000 [IU] | Freq: Once | INTRAMUSCULAR | Status: AC
  Administered 2024-05-07: 10 [IU] via SUBCUTANEOUS

## 2024-05-06 NOTE — ED Triage Notes (Signed)
 The pt has been out of insulin  since Friday  now his sugar is high

## 2024-05-06 NOTE — ED Provider Notes (Incomplete)
  [facility]EMERGENCY DEPT [facility] Emergency Department Provider Note MRN:  [id number]  Arrival date & time: 05/06/24     Chief Complaint   Hyperglycemia   History of Present Illness   Jaime Pruitt is a 43 y.o. year-old male presents to the ED with chief complaint of ***.  {rbhistorian:27070} {RB interpreter (Optional):27221}  Review of Systems  Pertinent positive and negative review of systems noted in HPI.    Physical Exam   Vitals:   05/06/24 1936 05/06/24 2324  BP: (!) 132/102 132/86  Pulse: 89 68  Resp: 18 16  Temp: 98.1 F (36.7 C) 97.8 F (36.6 C)  SpO2: 99% 98%    CONSTITUTIONAL:  ***-appearing, NAD NEURO:  Alert and oriented x 3, CN 3-12 grossly intact*** EYES:  eyes equal and reactive ENT/NECK:  Supple, no stridor *** CARDIO:  ***, ***regular rhythm, appears well-perfused *** PULM:  No respiratory distress***, *** GI/GU:  non-distended, *** MSK/SPINE:  No gross deformities, no edema, moves all extremities *** SKIN:  no rash, ***, atraumatic   *Additional and/or pertinent findings included in MDM below  Diagnostic and Interventional Summary    EKG Interpretation Date/Time:    Ventricular Rate:    PR Interval:    QRS Duration:    QT Interval:    QTC Calculation:   R Axis:      Text Interpretation:        *** Labs Reviewed  COMPREHENSIVE METABOLIC PANEL WITH GFR - Abnormal; Notable for the following components:      Result Value   Sodium 133 (*)    Glucose, Bld 367 (*)    Creatinine, Ser 1.25 (*)    Total Bilirubin 1.3 (*)    All other components within normal limits  CBG MONITORING, ED - Abnormal; Notable for the following components:   Glucose-Capillary 327 (*)    All other components within normal limits  LIPASE, BLOOD  CBC  URINALYSIS, ROUTINE W REFLEX MICROSCOPIC    No orders to display    Medications - No data to display   Procedures  /  Critical Care Procedures  ED Course and Medical Decision Making  I have reviewed  the triage vital signs, the nursing notes, and pertinent available records from the EMR.  Social Determinants Affecting Complexity of Care: Patient {rbSocial Determinants:27067}. {rbsocialsolutions:27068}  ED Course:    Medical Decision Making     {rbcpddx (Optional):29772:::1} {rbabdddx (Optional):29773:s::1}  Consultants: {rbconsultants:27072}   Treatment and Plan: {rbadmissionvdc:27069}  {rbattending:27073}  Final Clinical Impressions(s) / ED Diagnoses  No diagnosis found.  ED Discharge Orders     None         Discharge Instructions Discussed with and Provided to Patient:   Discharge Instructions   None

## 2024-05-06 NOTE — ED Notes (Signed)
Pt unable to give urine sample in triage  

## 2024-05-06 NOTE — ED Provider Notes (Signed)
 [facility] Emergency Department Provider Note MRN:  [id number]  Arrival date & time: 05/07/24     Chief Complaint   Hyperglycemia   History of Present Illness   Jaime Pruitt is a 43 y.o. year-old male presents to the ED with chief complaint of hyperglycemia.  He states that he has been out of his insulin .  States that he felt like his blood sugar was running high and was able to check it today and it was in the high 300s.  He denies any nausea or vomiting.  Denies any recent illnesses.  Denies any other associated symptoms..  History provided by patient.   Review of Systems  Pertinent positive and negative review of systems noted in HPI.    Physical Exam   Vitals:   05/07/24 0100 05/07/24 0230  BP: 112/79 116/78  Pulse: 65 65  Resp: 18 16  Temp:    SpO2: 100% 100%    CONSTITUTIONAL:  non toxic-appearing, NAD NEURO:  Alert and oriented x 3, CN 3-12 grossly intact EYES:  eyes equal and reactive ENT/NECK:  Supple, no stridor  CARDIO:  normal rate, regular rhythm, appears well-perfused  PULM:  No respiratory distress, CTAB GI/GU:  non-distended,  MSK/SPINE:  No gross deformities, no edema, moves all extremities  SKIN:  no rash, atraumatic   *Additional and/or pertinent findings included in MDM below  Diagnostic and Interventional Summary    EKG Interpretation Date/Time:    Ventricular Rate:    PR Interval:    QRS Duration:    QT Interval:    QTC Calculation:   R Axis:      Text Interpretation:         Labs Reviewed  COMPREHENSIVE METABOLIC PANEL WITH GFR - Abnormal; Notable for the following components:      Result Value   Sodium 133 (*)    Glucose, Bld 367 (*)    Creatinine, Ser 1.25 (*)    Total Bilirubin 1.3 (*)    All other components within normal limits  URINALYSIS, ROUTINE W REFLEX MICROSCOPIC - Abnormal; Notable for the following components:   Glucose, UA >=500 (*)    All other components within normal limits  CBG  MONITORING, ED - Abnormal; Notable for the following components:   Glucose-Capillary 327 (*)    All other components within normal limits  CBG MONITORING, ED - Abnormal; Notable for the following components:   Glucose-Capillary 297 (*)    All other components within normal limits  CBG MONITORING, ED - Abnormal; Notable for the following components:   Glucose-Capillary 224 (*)    All other components within normal limits  LIPASE, BLOOD  CBC    No orders to display    Medications  insulin  aspart (novoLOG ) injection 10 Units (10 Units Subcutaneous Given 05/07/24 0005)     Procedures  /  Critical Care Procedures  ED Course and Medical Decision Making  I have reviewed the triage vital signs, the nursing notes, and pertinent available records from the EMR.  Social Determinants Affecting Complexity of Care: Patient has no clinically significant social determinants affecting this chief complaint..   ED Course:    Medical Decision Making Patient here for hyperglycemia.  He has been out of his insulin .  States that he was able to check his blood sugar today and it was high and in the 300s.  He states he normally runs in the low 200s.  He states that he knows that his A1c is quite high.  He also reports that he is out of his libre sensors.  Patient is noted to be hyperglycemic to 367, normal anion gap, doubt DKA.  Patient looks well.  Patient treated with fluids and insulin .  Glucose trending down nicely.  I have refilled his insulin .  Will discharge home.  Risk OTC drugs. Prescription drug management.         Consultants: No consultations were needed in caring for this patient.   Treatment and Plan: I considered admission due to patient's initial presentation, but after considering the examination and diagnostic results, patient will not require admission and can be discharged with outpatient follow-up.    Final Clinical Impressions(s) / ED Diagnoses     ICD-10-CM   1.  Hyperglycemia  R73.9       ED Discharge Orders          Ordered    insulin  NPH Human (NOVOLIN N) 100 UNIT/ML injection  2 times daily before meals        05/07/24 0303    Continuous Glucose Sensor (FREESTYLE LIBRE 2 SENSOR) MISC  As needed        05/07/24 0303              Discharge Instructions Discussed with and Provided to Patient:   Discharge Instructions   None      Vicky Charleston, PA-C 05/07/24 [id number]    Theadore Ozell HERO, MD 05/07/24 [phone]

## 2024-05-07 LAB — CBG MONITORING, ED
Glucose-Capillary: 297 mg/dL — ABNORMAL HIGH (ref 70–99)
Glucose-Capillary: 224 mg/dL — ABNORMAL HIGH (ref 70–99)

## 2024-05-07 MED ORDER — FREESTYLE LIBRE 2 SENSOR MISC: 1.0000 | 1 refills | Status: AC | PRN

## 2024-05-07 MED ORDER — INSULIN NPH (HUMAN) (ISOPHANE) 100 UNIT/ML ~~LOC~~ SUSP: 10.0000 [IU] | Freq: Two times a day (BID) | SUBCUTANEOUS | 0 refills | Status: AC
